# Patient Record
Sex: Female | Born: 1985 | Race: White | Hispanic: No | Marital: Married | Smoking: Never smoker
Health system: Southern US, Community
[De-identification: ages and names within clinical notes are randomized; demographics above are authoritative.]

## PROBLEM LIST (undated history)

## (undated) DIAGNOSIS — K589 Irritable bowel syndrome without diarrhea: Secondary | ICD-10-CM

## (undated) HISTORY — PX: MYRINGOTOMY WITH TUBE PLACEMENT: SHX5663

## (undated) HISTORY — PX: WISDOM TOOTH EXTRACTION: SHX21

---

## 2012-08-03 LAB — OB RESULTS CONSOLE ANTIBODY SCREEN: Antibody Screen: NEGATIVE

## 2012-08-03 LAB — OB RESULTS CONSOLE HEPATITIS B SURFACE ANTIGEN: Hepatitis B Surface Ag: NEGATIVE

## 2012-08-03 LAB — OB RESULTS CONSOLE RUBELLA ANTIBODY, IGM: Rubella: IMMUNE

## 2012-08-03 LAB — OB RESULTS CONSOLE RPR: RPR: NONREACTIVE

## 2012-08-03 LAB — OB RESULTS CONSOLE ABO/RH: RH Type: POSITIVE

## 2012-08-03 LAB — OB RESULTS CONSOLE HIV ANTIBODY (ROUTINE TESTING): HIV: NONREACTIVE

## 2012-08-17 LAB — OB RESULTS CONSOLE GC/CHLAMYDIA
CHLAMYDIA, DNA PROBE: NEGATIVE
GC PROBE AMP, GENITAL: NEGATIVE

## 2012-08-18 ENCOUNTER — Inpatient Hospital Stay (HOSPITAL_COMMUNITY): Admission: AD | Admit: 2012-08-18 | Payer: Self-pay | Source: Ambulatory Visit | Admitting: Obstetrics

## 2013-01-13 NOTE — L&D Delivery Note (Signed)
Operative Delivery Note At 1:30 AM a viable and healthy female was delivered via Vaginal, Vacuum Investment banker, operational(Extractor).  Presentation: vertex; Position: Occiput,, Anterior; Station: +3.  Verbal consent: obtained from patient.  Risks and benefits discussed in detail.  Risks include, but are not limited to the risks of anesthesia, bleeding, infection, damage to maternal tissues, fetal cephalhematoma.  There is also the risk of inability to effect vaginal delivery of the head, or shoulder dystocia that cannot be resolved by established maneuvers, leading to the need for emergency cesarean section. Due to fetal heart decelerations/deep variable decels APGAR: 8, 9; weight .  7lb 9 oz Placenta status: Intact, Spontaneous. Not sent   Cord: 3 vessels with the following complications: None.  Cord next to right leg Cord pH: none  Anesthesia: Epidural  Instruments:mushroom vacuum Episiotomy: None Lacerations: 2nd degree perineal ; right vaginal Sulcus Suture Repair: 3.0 chromic Est. Blood Loss (mL): 250  Mom to postpartum.  Baby to Couplet care / Skin to Skin.  Denise Medina A 03/02/2013, 2:17 AM

## 2013-02-08 LAB — OB RESULTS CONSOLE GBS: STREP GROUP B AG: NEGATIVE

## 2013-03-01 ENCOUNTER — Inpatient Hospital Stay (HOSPITAL_COMMUNITY)
Admission: AD | Admit: 2013-03-01 | Discharge: 2013-03-01 | Disposition: A | Discharge status: Home / Self Care | Payer: BC Managed Care – PPO | Source: Ambulatory Visit | Attending: Obstetrics and Gynecology | Admitting: Obstetrics and Gynecology

## 2013-03-01 ENCOUNTER — Encounter (HOSPITAL_COMMUNITY): Payer: Self-pay | Admitting: *Deleted

## 2013-03-01 ENCOUNTER — Inpatient Hospital Stay (HOSPITAL_COMMUNITY): Payer: BC Managed Care – PPO | Admitting: Anesthesiology

## 2013-03-01 ENCOUNTER — Inpatient Hospital Stay (HOSPITAL_COMMUNITY)
Admission: AD | Admit: 2013-03-01 | Discharge: 2013-03-04 | DRG: 775 | Disposition: A | Discharge status: Home / Self Care | Payer: BC Managed Care – PPO | Source: Ambulatory Visit | Attending: Obstetrics and Gynecology | Admitting: Obstetrics and Gynecology

## 2013-03-01 ENCOUNTER — Encounter (HOSPITAL_COMMUNITY): Payer: BC Managed Care – PPO | Admitting: Anesthesiology

## 2013-03-01 DIAGNOSIS — K589 Irritable bowel syndrome without diarrhea: Secondary | ICD-10-CM | POA: Diagnosis present

## 2013-03-01 DIAGNOSIS — D62 Acute posthemorrhagic anemia: Secondary | ICD-10-CM | POA: Diagnosis present

## 2013-03-01 DIAGNOSIS — O9902 Anemia complicating childbirth: Secondary | ICD-10-CM | POA: Diagnosis present

## 2013-03-01 HISTORY — DX: Irritable bowel syndrome, unspecified: K58.9

## 2013-03-01 LAB — CBC
HCT: 33.3 % — ABNORMAL LOW (ref 36.0–46.0)
HEMOGLOBIN: 11.9 g/dL — AB (ref 12.0–15.0)
MCH: 32 pg (ref 26.0–34.0)
MCHC: 35.7 g/dL (ref 30.0–36.0)
MCV: 89.5 fL (ref 78.0–100.0)
Platelets: 181 10*3/uL (ref 150–400)
RBC: 3.72 MIL/uL — ABNORMAL LOW (ref 3.87–5.11)
RDW: 13.2 % (ref 11.5–15.5)
WBC: 14.3 10*3/uL — AB (ref 4.0–10.5)

## 2013-03-01 MED ORDER — FLEET ENEMA 7-19 GM/118ML RE ENEM: 1.0000 | ENEMA | RECTAL | Status: DC | PRN

## 2013-03-01 MED ORDER — PHENYLEPHRINE 40 MCG/ML (10ML) SYRINGE FOR IV PUSH (FOR BLOOD PRESSURE SUPPORT)
80.0000 ug | PREFILLED_SYRINGE | INTRAVENOUS | Status: DC | PRN
  Filled 2013-03-01: qty 2
  Filled 2013-03-01: qty 10

## 2013-03-01 MED ORDER — LACTATED RINGERS IV SOLN
INTRAVENOUS | Status: DC
  Administered 2013-03-01: via INTRAVENOUS

## 2013-03-01 MED ORDER — CITRIC ACID-SODIUM CITRATE 334-500 MG/5ML PO SOLN: 30.0000 mL | ORAL | Status: DC | PRN

## 2013-03-01 MED ORDER — EPHEDRINE 5 MG/ML INJ
10.0000 mg | INTRAVENOUS | Status: DC | PRN
Start: 2013-03-01 — End: 2013-03-02
  Filled 2013-03-01: qty 2

## 2013-03-01 MED ORDER — OXYTOCIN BOLUS FROM INFUSION: 500.0000 mL | INTRAVENOUS | Status: DC

## 2013-03-01 MED ORDER — FENTANYL 2.5 MCG/ML BUPIVACAINE 1/10 % EPIDURAL INFUSION (WH - ANES)
14.0000 mL/h | INTRAMUSCULAR | Status: DC | PRN
  Administered 2013-03-01: 14 mL/h via EPIDURAL
  Filled 2013-03-01: qty 125

## 2013-03-01 MED ORDER — OXYCODONE-ACETAMINOPHEN 5-325 MG PO TABS: 1.0000 | ORAL_TABLET | ORAL | Status: DC | PRN

## 2013-03-01 MED ORDER — LIDOCAINE HCL (PF) 1 % IJ SOLN
30.0000 mL | INTRAMUSCULAR | Status: AC | PRN
  Administered 2013-03-02: 30 mL via SUBCUTANEOUS
  Filled 2013-03-01: qty 30

## 2013-03-01 MED ORDER — LIDOCAINE HCL (PF) 1 % IJ SOLN
INTRAMUSCULAR | Status: DC | PRN
  Administered 2013-03-01 (×4): 4 mL

## 2013-03-01 MED ORDER — BUTORPHANOL TARTRATE 1 MG/ML IJ SOLN: 2.0000 mg | INTRAMUSCULAR | Status: DC | PRN

## 2013-03-01 MED ORDER — OXYTOCIN 40 UNITS IN LACTATED RINGERS INFUSION - SIMPLE MED
62.5000 mL/h | INTRAVENOUS | Status: DC
  Administered 2013-03-02: 62.5 mL/h via INTRAVENOUS
  Filled 2013-03-01: qty 1000

## 2013-03-01 MED ORDER — PHENYLEPHRINE 40 MCG/ML (10ML) SYRINGE FOR IV PUSH (FOR BLOOD PRESSURE SUPPORT)
80.0000 ug | PREFILLED_SYRINGE | INTRAVENOUS | Status: DC | PRN
  Filled 2013-03-01: qty 2

## 2013-03-01 MED ORDER — DIPHENHYDRAMINE HCL 50 MG/ML IJ SOLN: 12.5000 mg | INTRAMUSCULAR | Status: DC | PRN

## 2013-03-01 MED ORDER — ACETAMINOPHEN 325 MG PO TABS: 650.0000 mg | ORAL_TABLET | ORAL | Status: DC | PRN

## 2013-03-01 MED ORDER — LACTATED RINGERS IV SOLN: 500.0000 mL | INTRAVENOUS | Status: DC | PRN

## 2013-03-01 MED ORDER — LACTATED RINGERS IV SOLN
500.0000 mL | Freq: Once | INTRAVENOUS | Status: DC
Start: 2013-03-01 — End: 2013-03-02

## 2013-03-01 MED ORDER — EPHEDRINE 5 MG/ML INJ
10.0000 mg | INTRAVENOUS | Status: DC | PRN
  Filled 2013-03-01: qty 4
  Filled 2013-03-01: qty 2

## 2013-03-01 MED ORDER — IBUPROFEN 600 MG PO TABS: 600.0000 mg | ORAL_TABLET | Freq: Four times a day (QID) | ORAL | Status: DC | PRN

## 2013-03-01 MED ORDER — ONDANSETRON HCL 4 MG/2ML IJ SOLN: 4.0000 mg | Freq: Four times a day (QID) | INTRAMUSCULAR | Status: DC | PRN

## 2013-03-01 NOTE — MAU Provider Note (Signed)
  History   Labor evaluation Good FM, no bleeding or ROM  CSN: [id number]  Arrival date and time: 03/01/13 [id number]   None     Chief Complaint  Patient presents with  . Labor Eval   HPI  OB History   Grav Para Term Preterm Abortions TAB SAB Ect Mult Living   1         0      Past Medical History  Diagnosis Date  . IBS (irritable bowel syndrome)     Past Surgical History  Procedure Laterality Date  . Wisdom tooth extraction    . Myringotomy with tube placement      childhood    Family History  Problem Relation Age of Onset  . Hypertension Mother   . Heart disease Father   . Hypertension Maternal Grandmother   . Heart disease Maternal Grandfather   . Heart disease Paternal Grandmother   . Stroke Paternal Grandmother   . Heart disease Paternal Grandfather     History  Substance Use Topics  . Smoking status: Never Smoker   . Smokeless tobacco: Never Used  . Alcohol Use: Yes     Comment: occas. prior to preg.    Allergies:  Allergies  Allergen Reactions  . Cranberry Nausea Only    One time, throat scratchy, felt swollen    No prescriptions prior to admission    ROS Physical Exam   Blood pressure 121/73, pulse 79, temperature 98.6 F (37 C), temperature source Oral, resp. rate 18.  Physical Exam VE per RN Reactive NST- rare contractions MAU Course  Procedures  MDM na  Assessment and Plan  Prodromal labor Reactive NsT.  DC home with labor warnings  Samiah Ricklefs J 03/01/2013, 11:29 AM

## 2013-03-01 NOTE — Progress Notes (Signed)
Patient given option to go home now or walk for 1 hour and be re-examined. Patient chooses to walk. Given water to drink.

## 2013-03-01 NOTE — MAU Note (Signed)
Contractions started at 0530, every 4-5 minutes. No leaking.  'lots of mucous d/c'. Was 2 cm yesterday.

## 2013-03-01 NOTE — Anesthesia Procedure Notes (Signed)
Epidural Patient location during procedure: OB Start time: 03/01/2013 10:50 PM  Staffing Performed by: anesthesiologist   Preanesthetic Checklist Completed: patient identified, site marked, surgical consent, pre-op evaluation, timeout performed, IV checked, risks and benefits discussed and monitors and equipment checked  Epidural Patient position: sitting Prep: site prepped and draped and DuraPrep Patient monitoring: continuous pulse ox and blood pressure Approach: midline Injection technique: LOR air  Needle:  Needle type: Tuohy  Needle gauge: 17 G Needle length: 9 cm and 9 Needle insertion depth: 5 cm cm Catheter type: closed end flexible Catheter size: 19 Gauge Catheter at skin depth: 10 cm Test dose: negative  Assessment Events: blood not aspirated, injection not painful, no injection resistance, negative IV test and no paresthesia  Additional Notes Discussed risk of headache, infection, bleeding, nerve injury and failed or incomplete block.  Patient voices understanding and wishes to proceed.  Epidural placed easily on first pass.  No paresthesia.  Patient tolerated procedure well with no apparent complications.  ARodman Pickle. Lacresha Fusilier MDReason for block:procedure for pain

## 2013-03-01 NOTE — MAU Note (Signed)
Patient states was discharge from MAU earlier today. Reports increase in contraction frequency; now every 2 minutes. Denies LOF. States having blood tinged mucous discharge. Reports good fetal movement. Was 2 cm earlier.

## 2013-03-01 NOTE — Anesthesia Preprocedure Evaluation (Signed)
Anesthesia Evaluation  Patient identified by MRN, date of birth, ID band Patient awake    Reviewed: Allergy & Precautions, H&P , NPO status , Patient's Chart, lab work & pertinent test results, reviewed documented beta blocker date and time   History of Anesthesia Complications Negative for: history of anesthetic complications  Airway Mallampati: I TM Distance: >3 FB Neck ROM: full    Dental  (+) Teeth Intact   Pulmonary neg pulmonary ROS,  breath sounds clear to auscultation        Cardiovascular negative cardio ROS  Rhythm:regular Rate:Normal     Neuro/Psych negative neurological ROS  negative psych ROS   GI/Hepatic Neg liver ROS, IBS   Endo/Other  negative endocrine ROSBMI 33  Renal/GU negative Renal ROS     Musculoskeletal   Abdominal   Peds  Hematology negative hematology ROS (+)   Anesthesia Other Findings   Reproductive/Obstetrics (+) Pregnancy                           Anesthesia Physical Anesthesia Plan  ASA: II  Anesthesia Plan: Epidural   Post-op Pain Management:    Induction:   Airway Management Planned:   Additional Equipment:   Intra-op Plan:   Post-operative Plan:   Informed Consent: I have reviewed the patients History and Physical, chart, labs and discussed the procedure including the risks, benefits and alternatives for the proposed anesthesia with the patient or authorized representative who has indicated his/her understanding and acceptance.     Plan Discussed with:   Anesthesia Plan Comments:         Anesthesia Quick Evaluation

## 2013-03-01 NOTE — H&P (Signed)
Denise Medina is a 28 y.o. female presenting @ 39 3/7 weeks for admission due to early labor Maternal Medical History:  Reason for admission: Contractions.   Fetal activity: Perceived fetal activity is normal.    Prenatal complications: no prenatal complications   OB History   Grav Para Term Preterm Abortions TAB SAB Ect Mult Living   1         0     Past Medical History  Diagnosis Date  . IBS (irritable bowel syndrome)    Past Surgical History  Procedure Laterality Date  . Wisdom tooth extraction    . Myringotomy with tube placement      childhood   Family History: family history includes Heart disease in her father, maternal grandfather, paternal grandfather, and paternal grandmother; Hypertension in her maternal grandmother and mother; Stroke in her paternal grandmother. Social History:  reports that she has never smoked. She has never used smokeless tobacco. She reports that she drinks alcohol. She reports that she does not use illicit drugs.   Prenatal Transfer Tool  Maternal Diabetes: No Genetic Screening: Declined Maternal Ultrasounds/Referrals: Normal Fetal Ultrasounds or other Referrals:  None Maternal Substance Abuse:  No Significant Maternal Medications:  None Significant Maternal Lab Results:  Lab values include: Group B Strep negative Other Comments:  AFP1 neg  ROS neg  Dilation: 3.5 Effacement (%): 80 Station: -1 Exam by:: Roxan Hockey. Robinson RN Blood pressure 136/79, pulse 77, temperature 98 F (36.7 C), temperature source Oral, resp. rate 18, height 5\' 4"  (1.626 m), weight 87.091 kg (192 lb), SpO2 100.00%. Exam Physical Exam  Constitutional: She is oriented to person, place, and time. She appears well-developed.  HENT:  Head: Atraumatic.  Eyes: EOM are normal.  Neck: Neck supple.  Cardiovascular: Regular rhythm.   Respiratory: Breath sounds normal.  GI: Soft.  Neurological: She is alert and oriented to person, place, and time.  Skin: Skin is warm and  dry.  Psychiatric: She has a normal mood and affect.    Prenatal labs: ABO, Rh: A/Positive/-- (07/22 0000) Antibody: Negative (07/22 0000) Rubella: Immune (07/22 0000) RPR: Nonreactive (07/22 0000)  HBsAg: Negative (07/22 0000)  HIV: Non-reactive (07/22 0000)  GBS: Negative (01/27 0000)   Assessment/Plan: Early Labor Term gestation P)admit routine labs. Epidural prn. Pitocin prn  Denise Medina A 03/01/2013, 10:17 PM

## 2013-03-01 NOTE — Discharge Instructions (Signed)
Keep your scheduled appointment for prenatal care. °

## 2013-03-02 ENCOUNTER — Encounter (HOSPITAL_COMMUNITY): Payer: Self-pay | Admitting: *Deleted

## 2013-03-02 LAB — RPR: RPR Ser Ql: NONREACTIVE

## 2013-03-02 MED ORDER — WITCH HAZEL-GLYCERIN EX PADS: 1.0000 "application " | MEDICATED_PAD | CUTANEOUS | Status: DC | PRN

## 2013-03-02 MED ORDER — OXYTOCIN 40 UNITS IN LACTATED RINGERS INFUSION - SIMPLE MED
1.0000 m[IU]/min | INTRAVENOUS | Status: DC
  Administered 2013-03-02: 2 m[IU]/min via INTRAVENOUS

## 2013-03-02 MED ORDER — PRENATAL MULTIVITAMIN CH
1.0000 | ORAL_TABLET | Freq: Every day | ORAL | Status: DC
  Administered 2013-03-02 – 2013-03-03 (×2): 1 via ORAL
  Filled 2013-03-02 (×2): qty 1

## 2013-03-02 MED ORDER — ONDANSETRON HCL 4 MG PO TABS: 4.0000 mg | ORAL_TABLET | ORAL | Status: DC | PRN

## 2013-03-02 MED ORDER — FLUTICASONE PROPIONATE 50 MCG/ACT NA SUSP
1.0000 | Freq: Every day | NASAL | Status: DC
  Administered 2013-03-02 – 2013-03-03 (×2): 1 via NASAL
  Filled 2013-03-02: qty 16

## 2013-03-02 MED ORDER — SIMETHICONE 80 MG PO CHEW: 80.0000 mg | CHEWABLE_TABLET | ORAL | Status: DC | PRN

## 2013-03-02 MED ORDER — FERROUS SULFATE 325 (65 FE) MG PO TABS
325.0000 mg | ORAL_TABLET | Freq: Two times a day (BID) | ORAL | Status: DC
  Administered 2013-03-02 – 2013-03-04 (×5): 325 mg via ORAL
  Filled 2013-03-02 (×5): qty 1

## 2013-03-02 MED ORDER — BENZOCAINE-MENTHOL 20-0.5 % EX AERO
1.0000 "application " | INHALATION_SPRAY | CUTANEOUS | Status: DC | PRN
  Administered 2013-03-04: 1 via TOPICAL
  Filled 2013-03-02 (×2): qty 56

## 2013-03-02 MED ORDER — TERBUTALINE SULFATE 1 MG/ML IJ SOLN: 0.2500 mg | Freq: Once | INTRAMUSCULAR | Status: DC | PRN

## 2013-03-02 MED ORDER — IBUPROFEN 600 MG PO TABS
600.0000 mg | ORAL_TABLET | Freq: Four times a day (QID) | ORAL | Status: DC
  Administered 2013-03-02 – 2013-03-04 (×9): 600 mg via ORAL
  Filled 2013-03-02 (×9): qty 1

## 2013-03-02 MED ORDER — SENNOSIDES-DOCUSATE SODIUM 8.6-50 MG PO TABS
2.0000 | ORAL_TABLET | ORAL | Status: DC
  Administered 2013-03-03 – 2013-03-04 (×2): 2 via ORAL
  Filled 2013-03-02 (×2): qty 2

## 2013-03-02 MED ORDER — ONDANSETRON HCL 4 MG/2ML IJ SOLN
4.0000 mg | INTRAMUSCULAR | Status: DC | PRN
Start: 2013-03-02 — End: 2013-03-04

## 2013-03-02 MED ORDER — LANOLIN HYDROUS EX OINT: TOPICAL_OINTMENT | CUTANEOUS | Status: DC | PRN

## 2013-03-02 MED ORDER — OXYCODONE-ACETAMINOPHEN 5-325 MG PO TABS
1.0000 | ORAL_TABLET | ORAL | Status: DC | PRN
  Administered 2013-03-02 – 2013-03-04 (×9): 1 via ORAL
  Filled 2013-03-02 (×9): qty 1

## 2013-03-02 MED ORDER — DIBUCAINE 1 % RE OINT
1.0000 "application " | TOPICAL_OINTMENT | RECTAL | Status: DC | PRN
  Administered 2013-03-02: 1 via RECTAL
  Filled 2013-03-02: qty 28

## 2013-03-02 MED ORDER — ZOLPIDEM TARTRATE 5 MG PO TABS
5.0000 mg | ORAL_TABLET | Freq: Every evening | ORAL | Status: DC | PRN
Start: 2013-03-02 — End: 2013-03-04

## 2013-03-02 MED ORDER — DIPHENHYDRAMINE HCL 25 MG PO CAPS: 25.0000 mg | ORAL_CAPSULE | Freq: Four times a day (QID) | ORAL | Status: DC | PRN

## 2013-03-02 MED ORDER — LACTATED RINGERS IV SOLN
INTRAVENOUS | Status: DC
Start: 2013-03-02 — End: 2013-03-02
  Administered 2013-03-02: 01:00:00 via INTRAUTERINE

## 2013-03-02 NOTE — Lactation Note (Signed)
This note was copied from the chart of Denise Dorothea GlassmanKatie Diclemente. Lactation Consultation Note Initial visit at 20 hours of age.  Mom reports several good feedings, but baby is fussy now she thinks she is gassy.  Baby does burp some with feedings.  Baby has had 3 voids and 4 stools.  Mom reports hearing some swallows.  Encouraged to feed with early feeding cues STS.  Feeding frequency discussed.  [facility] given and discussed.  Baby is laying STS on moms chest and rooting around.  Minimal assistance needed to assist with latch.  Baby latches well with few sucks on and off at this time.  Encouraged mom to call for assist as needed.     Patient Name: Denise Medina BJYNW'GToday's Date: 03/02/2013 Reason for consult: Initial assessment   Maternal Data Has patient been taught Hand Expression?: Yes  Feeding Feeding Type: Breast Fed Length of feed:  (few minutes)  LATCH Score/Interventions Latch: Grasps breast easily, tongue down, lips flanged, rhythmical sucking.  Audible Swallowing: A few with stimulation  Type of Nipple: Everted at rest and after stimulation  Comfort (Breast/Nipple): Soft / non-tender     Hold (Positioning): Assistance needed to correctly position infant at breast and maintain latch. Intervention(s): Breastfeeding basics reviewed;Support Pillows;Position options;Skin to skin  LATCH Score: 8  Lactation Tools Discussed/Used     Consult Status Consult Status: Follow-up Date: 03/03/13 Follow-up type: In-patient    Beverely RisenShoptaw, Arvella MerlesJana Lynn 03/02/2013, 10:27 PM

## 2013-03-02 NOTE — Progress Notes (Signed)
Denise Medina is a 28 y.o. G1P0 at [redacted]w[redacted]d by ultrasound admitted for active labor  Subjective: Chief Complaint  Patient presents with  . Labor Eval    Objective: BP 127/85  Pulse 76  Temp(Src) 97.5 F (36.4 C) (Oral)  Resp 18  Ht 5\' 4"  (1.626 m)  Wt 87.091 kg (192 lb)  BMI 32.94 kg/m2  SpO2 100%      FHT:  FHR: 150 bpm, variability: moderate,  accelerations:  Present,  decelerations:  Present after epidural lasted 3 mins UC:   irregular, every 2-3 minutes SVE:   6-7 cm dilated, 90% effaced, 0 station asynclytic AROM clear fluid. IUPC placed heavy show Tracing: cat 1  Labs: Lab Results  Component Value Date   WBC 14.3* 03/01/2013   HGB 11.9* 03/01/2013   HCT 33.3* 03/01/2013   MCV 89.5 03/01/2013   PLT 181 03/01/2013    Assessment / Plan: Spontaneous labor, progressing normally Term gestation P) exaggerated right sims. Pitocin augmentation Anticipated MOD:  NSVD  Denise Medina A 03/02/2013, 12:13 AM

## 2013-03-02 NOTE — Progress Notes (Signed)
S: notes pelvic pressure w/ ctx  O; Pitocin 2 miu VE  Unchanged ISE placed. Increased bloody show noted  Tracing: baseline 150  Some variable decels nonrepetitive Couplets/quad ctx  IMP: Variable decel due to cord compression  P) exaggerated right sims. amnioinfusion

## 2013-03-02 NOTE — Anesthesia Postprocedure Evaluation (Signed)
  Anesthesia Post-op Note  Patient: Denise Medina  Procedure(s) Performed: * No procedures listed *  Patient Location: Mother/Baby  Anesthesia Type:Epidural  Level of Consciousness: awake, alert , oriented and patient cooperative  Airway and Oxygen Therapy: Patient Spontanous Breathing  Post-op Pain: mild  Post-op Assessment: Patient's Cardiovascular Status Stable, Respiratory Function Stable, No headache, No backache, No residual numbness and No residual motor weakness  Post-op Vital Signs: stable  Complications: No apparent anesthesia complications

## 2013-03-03 LAB — CBC
HEMATOCRIT: 31 % — AB (ref 36.0–46.0)
Hemoglobin: 10.5 g/dL — ABNORMAL LOW (ref 12.0–15.0)
MCH: 31.3 pg (ref 26.0–34.0)
MCHC: 33.9 g/dL (ref 30.0–36.0)
MCV: 92.3 fL (ref 78.0–100.0)
Platelets: 178 10*3/uL (ref 150–400)
RBC: 3.36 MIL/uL — ABNORMAL LOW (ref 3.87–5.11)
RDW: 13.9 % (ref 11.5–15.5)
WBC: 13 10*3/uL — ABNORMAL HIGH (ref 4.0–10.5)

## 2013-03-03 MED ORDER — HYDROCORTISONE 1 % EX CREA
TOPICAL_CREAM | Freq: Four times a day (QID) | CUTANEOUS | Status: DC | PRN
  Administered 2013-03-03: 20:00:00 via TOPICAL
  Filled 2013-03-03: qty 28

## 2013-03-03 NOTE — Lactation Note (Signed)
This note was copied from the chart of Denise Dorothea GlassmanKatie Callow. Lactation Consultation Note  Patient Name: Denise Medina WUXLK'GToday's Date: 03/03/2013 Reason for consult: Follow-up assessment Mom reports she thinks baby is nursing well. Mom reports she had some mild tenderness last evening but this is improving with applying EBM before/after nursing. Baby asleep at this visit, recently fed. BF basics reviewed with Mom, cluster feeding discussed. Encouraged Mom to call for questions or concerns, or if she needs assist with latch.   Maternal Data    Feeding Feeding Type: Breast Fed Length of feed: 60 min  LATCH Score/Interventions Latch: Grasps breast easily, tongue down, lips flanged, rhythmical sucking.  Audible Swallowing: A few with stimulation Intervention(s): Skin to skin  Type of Nipple: Everted at rest and after stimulation  Comfort (Breast/Nipple): Soft / non-tender     Hold (Positioning): No assistance needed to correctly position infant at breast. Intervention(s): Support Pillows;Breastfeeding basics reviewed  LATCH Score: 9  Lactation Tools Discussed/Used     Consult Status Consult Status: Follow-up Date: 03/04/13 Follow-up type: In-patient    Denise Medina, Denise Medina 03/03/2013, 4:35 PM

## 2013-03-03 NOTE — Progress Notes (Addendum)
Patient ID: Denise KrabbeKatie V Catano, female   DOB: [birth year], 28 y.o.   MRN: [id number] PPD # 1 SVD  S:  Reports feeling well             Tolerating po/ No nausea or vomiting             Bleeding is spotting             Pain controlled with ibuprofen (OTC)             Up ad lib / ambulatory / voiding without difficulties , but reports urine has a strong "sulfur" smell   Newborn  Information for the patient's newborn:  [name], Girl Bless [[id number]][[id number]]  female  breast feeding   O:  A & O x 3, in no apparent distress              VS:  Filed Vitals:   03/02/13 0425 03/02/13 0830 03/02/13 1646 03/03/13 0653  BP: 120/76 118/78 112/68 106/69  Pulse: 94 78 82 81  Temp: 98.2 F (36.8 C) 98 F (36.7 C) 98.2 F (36.8 C) 97.5 F (36.4 C)  TempSrc: Oral Oral Oral Axillary  Resp: 18 18 18 19   Height:      Weight:      SpO2:    97%    LABS:  Recent Labs  03/01/13 2151 03/03/13 0625  WBC 14.3* 13.0*  HGB 11.9* 10.5*  HCT 33.3* 31.0*  PLT 181 178    Blood type: A/Positive/-- (07/22 0000)  Rubella: Immune (07/22 0000)   I&O: I/O last 3 completed shifts: In: -  Out: 250 [Blood:250]             Lungs: Clear and unlabored  Heart: regular rate and rhythm / no murmurs  Abdomen: soft, non-tender, non-distended             Fundus: firm, non-tender, U-1  Perineum: 2nd degree repair healing well  Lochia: spotting  Extremities: trace edema, no calf pain or tenderness, no Homans    A/P: PPD # 1  28 y.o., G1P1001, S/P Vacuum Extractor Assisted Vaginal Delivery with 2nd degree laceration    Doing well - stable status  Routine post partum orders  Anticipate discharge tomorrow    Raelyn MoraAWSON, Aeriana Speece, M, MSN, CNM 03/03/2013, 9:07 AM

## 2013-03-04 MED ORDER — OXYCODONE-ACETAMINOPHEN 5-325 MG PO TABS: 1.0000 | ORAL_TABLET | ORAL | Status: DC | PRN

## 2013-03-04 MED ORDER — IBUPROFEN 600 MG PO TABS: 600.0000 mg | ORAL_TABLET | Freq: Four times a day (QID) | ORAL | Status: DC | PRN

## 2013-03-04 MED ORDER — SENNOSIDES-DOCUSATE SODIUM 8.6-50 MG PO TABS: 2.0000 | ORAL_TABLET | Freq: Every evening | ORAL | Status: DC | PRN

## 2013-03-04 NOTE — Progress Notes (Signed)
Patient ID: Denise KrabbeKatie V Kratky, female   DOB: Jan 02, 1986, 28 y.o.   MRN: [id number] PPD # 2  Subjective: Pt reports feeling well, but still significant soreness to vaginal repair / Pain controlled with ibuprofen and percocet Tolerating po/ Voiding without problems/ No n/v Bleeding is light/ Newborn info:  Information for the patient's newborn:  [name], Girl Nadege [[id number]][[id number]]  female Feeding: breast    Objective:  VS: Blood pressure 102/62, pulse 74, temperature 98.5 F (36.9 C), temperature source Oral, resp. rate 18.    Recent Labs  03/01/13 2151 03/03/13 0625  WBC 14.3* 13.0*  HGB 11.9* 10.5*  HCT 33.3* 31.0*  PLT 181 178    Blood type: A/Positive Rubella: Immune    Physical Exam:  General:  alert, cooperative and no distress CV: Regular rate and rhythm Resp: clear Abdomen: soft, nontender, normal bowel sounds Uterine Fundus: firm, below umbilicus, nontender Perineum: healing with good reapproximation and mild edema persists Lochia: minimal Ext: Homans sign is negative, no sign of DVT and no edema, redness or tenderness in the calves or thighs    A/P: PPD # 2/ G1P1001/ S/P: VAVD with 2nd deg laceration with repair Mild ABL Anemia; urged red meat and dark leafy greens. Doing well and stable for discharge home RX: Ibuprofen 600mg  po Q 6 hrs prn pain #30 Refill x 1 OTC iron supplement Percocet 5/325 1 to 2 po Q 4 hrs prn pain #15 No refill Colace 100mg  po up to TID prn #30 Ref x 1 WOB/GYN booklet given Routine pp visit in 6wks   Demetrius RevelFISHER,Osmany Azer K, MSN, [facility] 03/04/2013, 8:49 AM

## 2013-03-04 NOTE — Discharge Summary (Signed)
Obstetric Discharge Summary Reason for Admission: G1 P0 @ 39wks in early labor Prenatal Procedures: NST and ultrasound Intrapartum Procedures: VAVD Postpartum Procedures: none Complications-Operative and Postpartum: 2nd degree perineal laceration Hemoglobin  Date Value Ref Range Status  03/03/2013 10.5* 12.0 - 15.0 g/dL Final     HCT  Date Value Ref Range Status  03/03/2013 31.0* 36.0 - 46.0 % Final    Physical Exam:  General: alert, cooperative and no distress Lochia: appropriate Uterine Fundus: firm Incision: 2nd deg repair well approximated DVT Evaluation: No evidence of DVT seen on physical exam. Negative Homan's sign.  Discharge Diagnoses: G1 P1 s/p VAVD with 2nd deg lac with repair  Discharge Information: Date: 03/04/2013 Activity: pelvic rest Diet: routine Medications: PNV, Ibuprofen, Colace and Percocet Condition: stable Instructions: refer to practice specific booklet Discharge to: home Follow-up Information   [facility] 6 weeks.   Specialty:  Obstetrics and Gynecology   Contact information:   [address]elda Severe[address] [place] [id number] [phone]       Newborn Data: Live born female on 03/02/13 Donnie Aho([place]) Birth Weight: 7 lb 9.3 oz (3440 g) APGAR: 8, 9  Home with mother.  FISHER,JULIE K 03/04/2013, 8:52 AM

## 2013-11-14 ENCOUNTER — Encounter (HOSPITAL_COMMUNITY): Payer: Self-pay | Admitting: *Deleted

## 2013-12-23 ENCOUNTER — Emergency Department: Payer: Self-pay | Admitting: Student

## 2013-12-23 LAB — HCG, QUANTITATIVE, PREGNANCY

## 2015-01-14 NOTE — L&D Delivery Note (Signed)
Delivery Note At 4:14 AM a viable female was delivered via Vaginal, Spontaneous Delivery (Presentation: ;  DOA).  APGAR: ,per nurse ; weight pending .   Placenta status: ,spontaneous, intact .  Cord:  3VC with the following complications: none.  Cord pH: not indicated  Anesthesia:  epidural Episiotomy: None Lacerations: 2nd degree Suture Repair: 2.0 vicryl rapide Est. Blood Loss (mL): 300  Mom to postpartum.  Baby to Couplet care / Skin to Skin.  Denise Medina A. 11/27/2015, 4:32 AM

## 2015-05-15 DIAGNOSIS — Z113 Encounter for screening for infections with a predominantly sexual mode of transmission: Secondary | ICD-10-CM | POA: Diagnosis not present

## 2015-05-15 DIAGNOSIS — Z3481 Encounter for supervision of other normal pregnancy, first trimester: Secondary | ICD-10-CM | POA: Diagnosis not present

## 2015-05-15 DIAGNOSIS — Z36 Encounter for antenatal screening of mother: Secondary | ICD-10-CM | POA: Diagnosis not present

## 2015-05-15 LAB — OB RESULTS CONSOLE RPR: RPR: NONREACTIVE

## 2015-05-15 LAB — OB RESULTS CONSOLE HEPATITIS B SURFACE ANTIGEN: HEP B S AG: NEGATIVE

## 2015-05-15 LAB — OB RESULTS CONSOLE GC/CHLAMYDIA
Chlamydia: NEGATIVE
Gonorrhea: NEGATIVE

## 2015-05-15 LAB — OB RESULTS CONSOLE HIV ANTIBODY (ROUTINE TESTING): HIV: NONREACTIVE

## 2015-05-15 LAB — OB RESULTS CONSOLE ANTIBODY SCREEN: Antibody Screen: NEGATIVE

## 2015-05-15 LAB — OB RESULTS CONSOLE ABO/RH: RH TYPE: POSITIVE

## 2015-05-15 LAB — OB RESULTS CONSOLE RUBELLA ANTIBODY, IGM: Rubella: IMMUNE

## 2015-06-14 DIAGNOSIS — N898 Other specified noninflammatory disorders of vagina: Secondary | ICD-10-CM | POA: Diagnosis not present

## 2015-06-14 DIAGNOSIS — Z36 Encounter for antenatal screening of mother: Secondary | ICD-10-CM | POA: Diagnosis not present

## 2015-06-14 DIAGNOSIS — Z3482 Encounter for supervision of other normal pregnancy, second trimester: Secondary | ICD-10-CM | POA: Diagnosis not present

## 2015-07-06 DIAGNOSIS — Z36 Encounter for antenatal screening of mother: Secondary | ICD-10-CM | POA: Diagnosis not present

## 2015-07-13 DIAGNOSIS — Z3A19 19 weeks gestation of pregnancy: Secondary | ICD-10-CM | POA: Diagnosis not present

## 2015-07-13 DIAGNOSIS — O26892 Other specified pregnancy related conditions, second trimester: Secondary | ICD-10-CM | POA: Diagnosis not present

## 2015-07-27 IMAGING — CR DG LUMBAR SPINE 2-3V
1 series · 3 of 3 positions shown · non-contrast
Comparison: None.

CLINICAL DATA: Fell from a fork lift and hit back.

EXAM:
LUMBAR SPINE - 2-3 VIEW; PELVIS - 1-2 VIEW

[Series 1: dxr lumbar spine ap and lateral · 0.14mm/px · 3 of 3 slices shown]
[im 1/3]
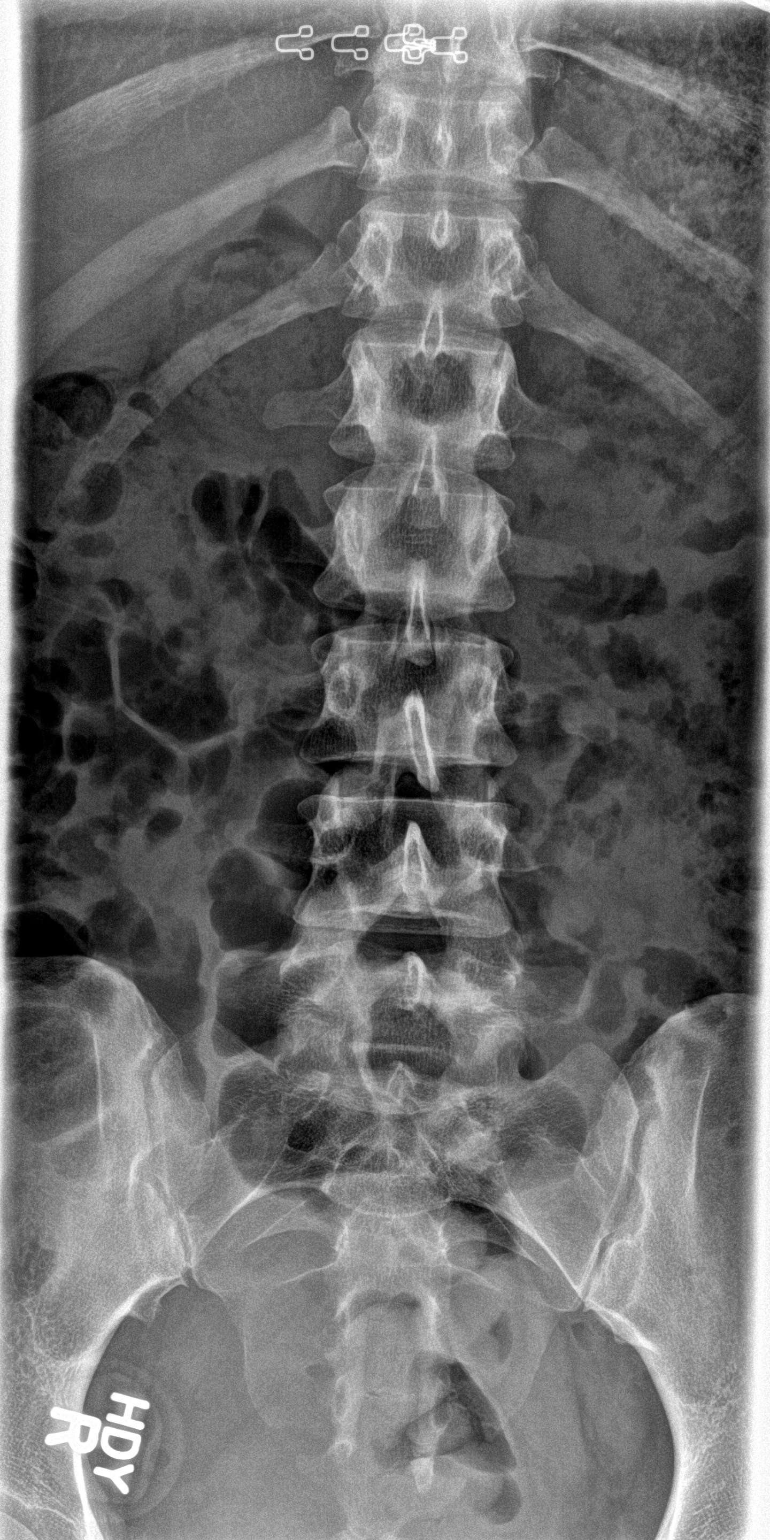
[im 2/3]
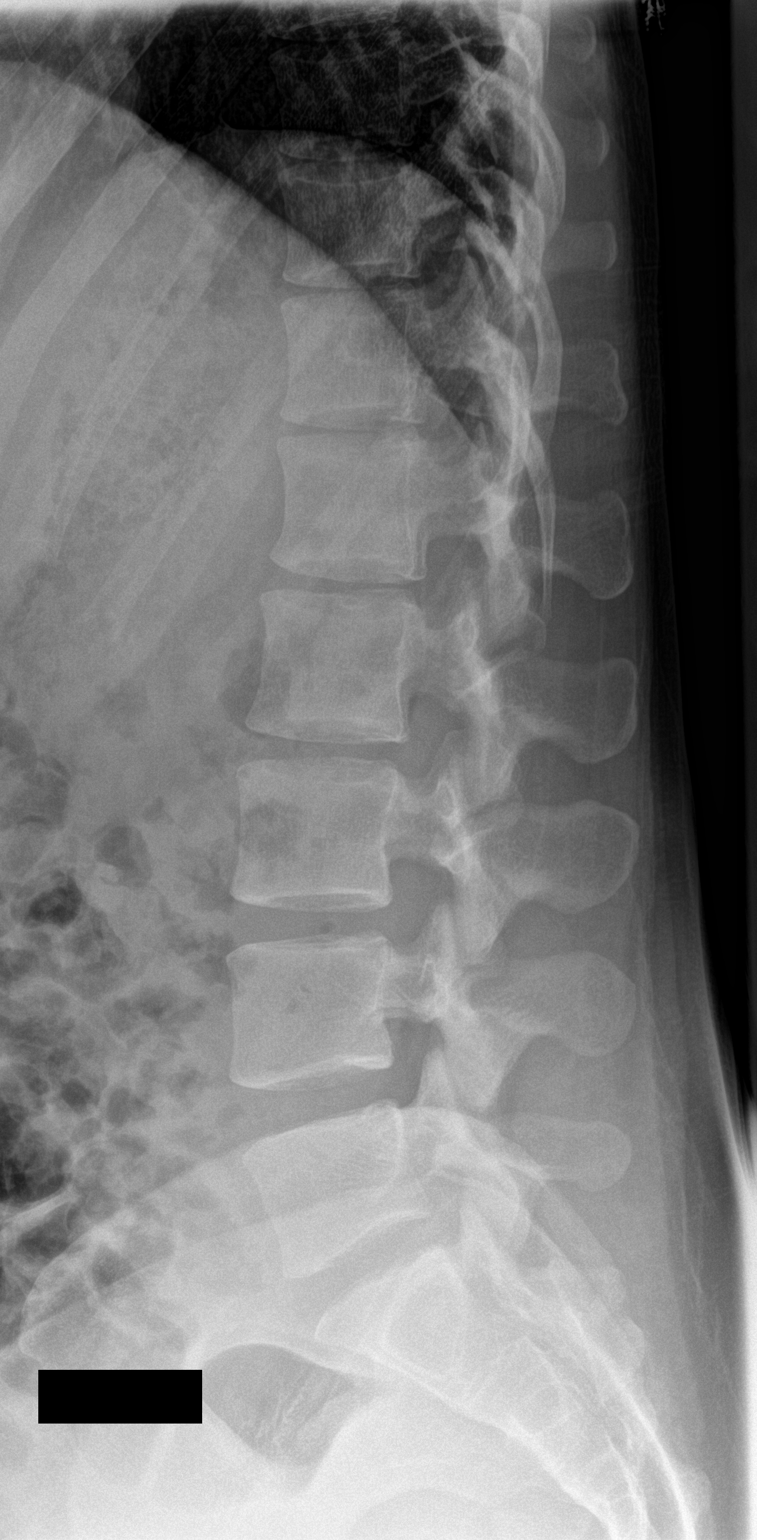
[im 3/3]
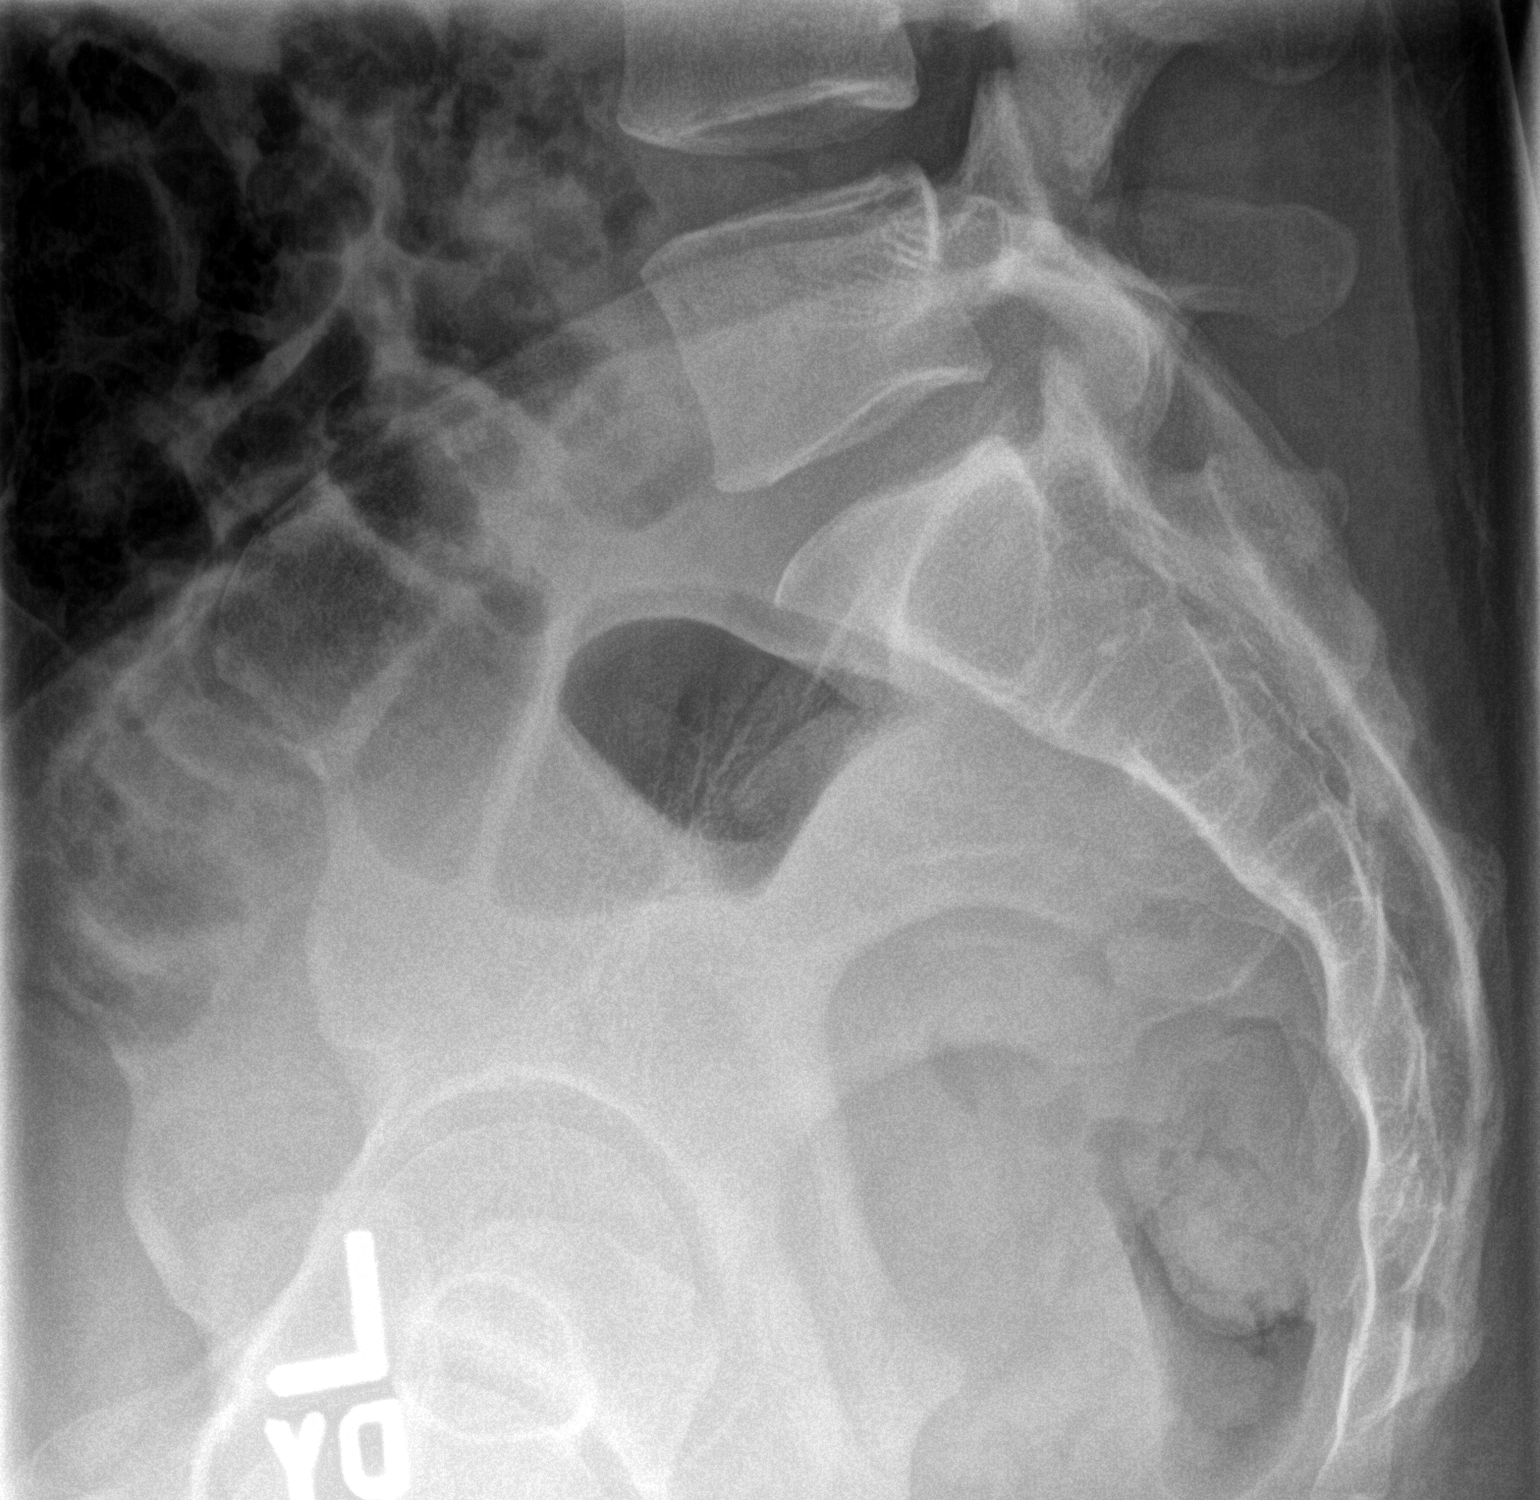

[3 of 3 positions shown; findings below may reference images not displayed]

FINDINGS: Lumbar spine:

Normal alignment of the lumbar vertebral bodies. Disc spaces and
vertebral bodies are maintained. The facets are normally aligned. No
pars defects. The visualized bony pelvis is intact.

Pelvis:

Both hips are normally located. No acute fracture. The pubic
symphysis and SI joints are intact. No pelvic fractures.
IMPRESSION: No acute bony findings.

## 2015-08-29 DIAGNOSIS — Z36 Encounter for antenatal screening of mother: Secondary | ICD-10-CM | POA: Diagnosis not present

## 2015-08-29 DIAGNOSIS — Z3A26 26 weeks gestation of pregnancy: Secondary | ICD-10-CM | POA: Diagnosis not present

## 2015-08-29 DIAGNOSIS — N898 Other specified noninflammatory disorders of vagina: Secondary | ICD-10-CM | POA: Diagnosis not present

## 2015-08-29 DIAGNOSIS — O26892 Other specified pregnancy related conditions, second trimester: Secondary | ICD-10-CM | POA: Diagnosis not present

## 2015-09-14 ENCOUNTER — Inpatient Hospital Stay (HOSPITAL_COMMUNITY): Admission: AD | Admit: 2015-09-14 | Payer: BLUE CROSS/BLUE SHIELD | Source: Ambulatory Visit | Admitting: Obstetrics

## 2015-09-14 DIAGNOSIS — Z23 Encounter for immunization: Secondary | ICD-10-CM | POA: Diagnosis not present

## 2015-09-14 DIAGNOSIS — Z36 Encounter for antenatal screening of mother: Secondary | ICD-10-CM | POA: Diagnosis not present

## 2015-09-14 DIAGNOSIS — Z3A28 28 weeks gestation of pregnancy: Secondary | ICD-10-CM | POA: Diagnosis not present

## 2015-09-14 DIAGNOSIS — O26893 Other specified pregnancy related conditions, third trimester: Secondary | ICD-10-CM | POA: Diagnosis not present

## 2015-09-28 DIAGNOSIS — O26893 Other specified pregnancy related conditions, third trimester: Secondary | ICD-10-CM | POA: Diagnosis not present

## 2015-09-28 DIAGNOSIS — Z3A30 30 weeks gestation of pregnancy: Secondary | ICD-10-CM | POA: Diagnosis not present

## 2015-09-28 DIAGNOSIS — Z23 Encounter for immunization: Secondary | ICD-10-CM | POA: Diagnosis not present

## 2015-11-09 DIAGNOSIS — Z3A36 36 weeks gestation of pregnancy: Secondary | ICD-10-CM | POA: Diagnosis not present

## 2015-11-09 DIAGNOSIS — Z3685 Encounter for antenatal screening for Streptococcus B: Secondary | ICD-10-CM | POA: Diagnosis not present

## 2015-11-09 DIAGNOSIS — O26893 Other specified pregnancy related conditions, third trimester: Secondary | ICD-10-CM | POA: Diagnosis not present

## 2015-11-09 LAB — OB RESULTS CONSOLE GBS: GBS: NEGATIVE

## 2015-11-25 ENCOUNTER — Inpatient Hospital Stay (HOSPITAL_COMMUNITY): Payer: BLUE CROSS/BLUE SHIELD

## 2015-11-25 ENCOUNTER — Encounter (HOSPITAL_COMMUNITY): Payer: Self-pay | Admitting: Certified Nurse Midwife

## 2015-11-25 ENCOUNTER — Inpatient Hospital Stay (EMERGENCY_DEPARTMENT_HOSPITAL)
Admission: AD | Admit: 2015-11-25 | Discharge: 2015-11-25 | Disposition: A | Discharge status: Home / Self Care | Payer: BLUE CROSS/BLUE SHIELD | Source: Ambulatory Visit | Attending: Obstetrics & Gynecology | Admitting: Obstetrics & Gynecology

## 2015-11-25 DIAGNOSIS — Z3A39 39 weeks gestation of pregnancy: Secondary | ICD-10-CM | POA: Diagnosis not present

## 2015-11-25 DIAGNOSIS — Z3A00 Weeks of gestation of pregnancy not specified: Secondary | ICD-10-CM | POA: Diagnosis not present

## 2015-11-25 DIAGNOSIS — Z823 Family history of stroke: Secondary | ICD-10-CM | POA: Diagnosis not present

## 2015-11-25 DIAGNOSIS — O133 Gestational [pregnancy-induced] hypertension without significant proteinuria, third trimester: Secondary | ICD-10-CM

## 2015-11-25 DIAGNOSIS — O163 Unspecified maternal hypertension, third trimester: Secondary | ICD-10-CM

## 2015-11-25 DIAGNOSIS — Z3A38 38 weeks gestation of pregnancy: Secondary | ICD-10-CM

## 2015-11-25 DIAGNOSIS — O2243 Hemorrhoids in pregnancy, third trimester: Secondary | ICD-10-CM | POA: Diagnosis not present

## 2015-11-25 DIAGNOSIS — Z0371 Encounter for suspected problem with amniotic cavity and membrane ruled out: Secondary | ICD-10-CM | POA: Diagnosis not present

## 2015-11-25 DIAGNOSIS — K219 Gastro-esophageal reflux disease without esophagitis: Secondary | ICD-10-CM | POA: Diagnosis not present

## 2015-11-25 DIAGNOSIS — Z8249 Family history of ischemic heart disease and other diseases of the circulatory system: Secondary | ICD-10-CM | POA: Diagnosis not present

## 2015-11-25 DIAGNOSIS — Z23 Encounter for immunization: Secondary | ICD-10-CM | POA: Diagnosis not present

## 2015-11-25 DIAGNOSIS — O9962 Diseases of the digestive system complicating childbirth: Secondary | ICD-10-CM | POA: Diagnosis not present

## 2015-11-25 DIAGNOSIS — O26893 Other specified pregnancy related conditions, third trimester: Secondary | ICD-10-CM

## 2015-11-25 DIAGNOSIS — Z412 Encounter for routine and ritual male circumcision: Secondary | ICD-10-CM | POA: Diagnosis not present

## 2015-11-25 DIAGNOSIS — Z3403 Encounter for supervision of normal first pregnancy, third trimester: Secondary | ICD-10-CM | POA: Diagnosis not present

## 2015-11-25 DIAGNOSIS — N898 Other specified noninflammatory disorders of vagina: Secondary | ICD-10-CM

## 2015-11-25 DIAGNOSIS — O134 Gestational [pregnancy-induced] hypertension without significant proteinuria, complicating childbirth: Secondary | ICD-10-CM | POA: Diagnosis not present

## 2015-11-25 LAB — PROTEIN / CREATININE RATIO, URINE
Creatinine, Urine: 59 mg/dL
Protein Creatinine Ratio: 0.12 mg/mg{Cre} (ref 0.00–0.15)
TOTAL PROTEIN, URINE: 7 mg/dL

## 2015-11-25 LAB — COMPREHENSIVE METABOLIC PANEL
ALK PHOS: 127 U/L — AB (ref 38–126)
ALT: 16 U/L (ref 14–54)
AST: 19 U/L (ref 15–41)
Albumin: 3 g/dL — ABNORMAL LOW (ref 3.5–5.0)
Anion gap: 7 (ref 5–15)
BILIRUBIN TOTAL: 0.8 mg/dL (ref 0.3–1.2)
BUN: 11 mg/dL (ref 6–20)
CALCIUM: 9.1 mg/dL (ref 8.9–10.3)
CO2: 21 mmol/L — ABNORMAL LOW (ref 22–32)
CREATININE: 0.56 mg/dL (ref 0.44–1.00)
Chloride: 106 mmol/L (ref 101–111)
Glucose, Bld: 108 mg/dL — ABNORMAL HIGH (ref 65–99)
Potassium: 4.1 mmol/L (ref 3.5–5.1)
SODIUM: 134 mmol/L — AB (ref 135–145)
TOTAL PROTEIN: 6.4 g/dL — AB (ref 6.5–8.1)

## 2015-11-25 LAB — CBC
HEMATOCRIT: 31.9 % — AB (ref 36.0–46.0)
HEMOGLOBIN: 11.4 g/dL — AB (ref 12.0–15.0)
MCH: 32.6 pg (ref 26.0–34.0)
MCHC: 35.7 g/dL (ref 30.0–36.0)
MCV: 91.1 fL (ref 78.0–100.0)
Platelets: 175 10*3/uL (ref 150–400)
RBC: 3.5 MIL/uL — AB (ref 3.87–5.11)
RDW: 13.6 % (ref 11.5–15.5)
WBC: 10.3 10*3/uL (ref 4.0–10.5)

## 2015-11-25 LAB — URINALYSIS, ROUTINE W REFLEX MICROSCOPIC
Bilirubin Urine: NEGATIVE
GLUCOSE, UA: NEGATIVE mg/dL
Hgb urine dipstick: NEGATIVE
Ketones, ur: NEGATIVE mg/dL
Nitrite: NEGATIVE
PH: 6 (ref 5.0–8.0)
Protein, ur: NEGATIVE mg/dL
Specific Gravity, Urine: 1.01 (ref 1.005–1.030)

## 2015-11-25 LAB — URINE MICROSCOPIC-ADD ON

## 2015-11-25 LAB — AMNISURE RUPTURE OF MEMBRANE (ROM) NOT AT ARMC: Amnisure ROM: NEGATIVE

## 2015-11-25 LAB — POCT FERN TEST

## 2015-11-25 NOTE — MAU Note (Signed)
Pt states she woke at 430AM and had a gush of fluid. Pt continues to leak small amounts. Pt denies ctxs and vaginal bleeding. Fetus is active.

## 2015-11-25 NOTE — MAU Provider Note (Signed)
Chief Complaint  Patient presents with  . Rupture of Membranes     First Provider Initiated Contact with Patient 11/25/15 1356      S: Maree KrabbeKatie V Crutcher  is a 30 y.o. y.o. year old 682P1001 female at 1120w5d weeks gestation who presents to MAU reporting leaking of Clear fluid since [id number]. Had a gush of fluid initially when she got up to go to the bathroom and has had a small amount of continuous leaking since then. Two mildly elevated BP's noted at MAU visit today. No Hx of HTN per pt or PNR.   Reports using Evening Primrose Oil vaginally daily for the past week.   Contractions: None Vaginal bleeding: None Fetal movement: Normal Denies HA, vision changes or epigastric pain.  O:  Patient Vitals for the past 24 hrs:  BP Temp Temp src Pulse Resp SpO2  11/25/15 1549 139/75 - - 88 18 -  11/25/15 1442 121/67 - - 85 - -  11/25/15 1435 117/70 - - 89 - -  11/25/15 1425 122/72 - - 91 - -  11/25/15 1250 148/87 - - 94 - 99 %  11/25/15 1249 148/87 98.1 F (36.7 C) Oral 89 20 -   General: NAD Heart: Regular rate Lungs: Normal rate and effort Abd: Soft, NT, Gravid, S=D Pelvic: NEFG, Neg pooling, no blood.  Dilation: 1.5 Effacement (%): 70 Cervical Position: Middle Station: -3 Presentation: Vertex Exam by:: AYetta Barre. Jones RNC  EFM: 130, reactive Toco: Irreg, mild  Neg Fern Neg Amnisure  Results for orders placed or performed during the hospital encounter of 11/25/15 (from the past 24 hour(s))  Amnisure rupture [facility] membrane (rom)not [facility]     Status: None   Collection Time: 11/25/15  2:15 PM  Result Value Ref Range   Amnisure ROM NEGATIVE   CBC     Status: Abnormal   Collection Time: 11/25/15  2:47 PM  Result Value Ref Range   WBC 10.3 4.0 - 10.5 K/uL   RBC 3.50 (L) 3.87 - 5.11 MIL/uL   Hemoglobin 11.4 (L) 12.0 - 15.0 g/dL   HCT 16.131.9 (L) 09.636.0 - 04.546.0 %   MCV 91.1 78.0 - 100.0 fL   MCH 32.6 26.0 - 34.0 pg   MCHC 35.7 30.0 - 36.0 g/dL   RDW 40.913.6 81.111.5 - 91.415.5 %   Platelets 175 150 - 400  K/uL  Comprehensive metabolic panel     Status: Abnormal   Collection Time: 11/25/15  2:47 PM  Result Value Ref Range   Sodium 134 (L) 135 - 145 mmol/L   Potassium 4.1 3.5 - 5.1 mmol/L   Chloride 106 101 - 111 mmol/L   CO2 21 (L) 22 - 32 mmol/L   Glucose, Bld 108 (H) 65 - 99 mg/dL   BUN 11 6 - 20 mg/dL   Creatinine, Ser 7.820.56 0.44 - 1.00 mg/dL   Calcium 9.1 8.9 - 95.610.3 mg/dL   Total Protein 6.4 (L) 6.5 - 8.1 g/dL   Albumin 3.0 (L) 3.5 - 5.0 g/dL   AST 19 15 - 41 U/L   ALT 16 14 - 54 U/L   Alkaline Phosphatase 127 (H) 38 - 126 U/L   Total Bilirubin 0.8 0.3 - 1.2 mg/dL   GFR calc non Af Amer >60 >60 mL/min   GFR calc Af Amer >60 >60 mL/min   Anion gap 7 5 - 15  Protein / creatinine ratio, urine     Status: None   Collection Time: 11/25/15  2:55 PM  Result Value Ref Range   Creatinine, Urine 59.00 mg/dL   Total Protein, Urine 7 mg/dL   Protein Creatinine Ratio 0.12 0.00 - 0.15 mg/mg[Cre]  Urinalysis, Routine w reflex microscopic (not at [facility])     Status: Abnormal   Collection Time: 11/25/15  2:55 PM  Result Value Ref Range   Color, Urine YELLOW YELLOW   APPearance HAZY (A) CLEAR   Specific Gravity, Urine 1.010 1.005 - 1.030   pH 6.0 5.0 - 8.0   Glucose, UA NEGATIVE NEGATIVE mg/dL   Hgb urine dipstick NEGATIVE NEGATIVE   Bilirubin Urine NEGATIVE NEGATIVE   Ketones, ur NEGATIVE NEGATIVE mg/dL   Protein, ur NEGATIVE NEGATIVE mg/dL   Nitrite NEGATIVE NEGATIVE   Leukocytes, UA SMALL (A) NEGATIVE  Urine microscopic-add on     Status: Abnormal   Collection Time: 11/25/15  2:55 PM  Result Value Ref Range   Squamous Epithelial / LPF 0-5 (A) NONE SEEN   WBC, UA 6-30 0 - 5 WBC/hpf   RBC / HPF 0-5 0 - 5 RBC/hpf   Bacteria, UA MANY (A) NONE SEEN  POCT fern test     Status: Normal   Collection Time: 11/25/15  3:19 PM  Result Value Ref Range   POCT Fern Test     RN notified Dr. Seymour BarsLavoie of Hx, exam, BP's. She ordered BPP w/ AFI-->BPP 8/8, AFI 16 cm  A: 7494w5d week IUP No evidence of  SROM. Suspect leaking was Evening Primrose oil.  GHT w/out evidence of Pre-E. FHR reactive  P: Discharge home in stable condition per consult w/ Genia DelMarie-Lyne Lavoie, MD. Labor precautions and fetal kick counts. Pre-E precautions Follow-up tomorrow as scheduled for prenatal visit and BP or sooner as needed if symptoms worsen. Return to maternity admissions as needed if symptoms worsen.  [place] Arden [place], CNM 11/25/2015 2:11 PM  2

## 2015-11-25 NOTE — MAU Note (Signed)
RN spoke to Dr. Seymour BarsLavoie regarding ctxs, FHT, and VS. Orders received to obtain 1 additional BP and if abnormal, order CBC, CMP, and protein/creatinine ratio. RN states that 3 fern slides, a speculum exam by Ivonne AndrewV. Smith CNM, and an amnisure are all negative for amniotic fluid. Dr. Seymour BarsLavoie orders U/S for AFI. Subsequent BP is 121/67. Pt denies HA, epigastric pain, or visual changes.

## 2015-11-25 NOTE — Discharge Instructions (Signed)

## 2015-11-26 ENCOUNTER — Encounter (HOSPITAL_COMMUNITY): Payer: Self-pay

## 2015-11-26 ENCOUNTER — Inpatient Hospital Stay (HOSPITAL_COMMUNITY)
Admission: AD | Admit: 2015-11-26 | Discharge: 2015-11-28 | DRG: 775 | Disposition: A | Discharge status: Home / Self Care | Payer: BLUE CROSS/BLUE SHIELD | Source: Ambulatory Visit | Attending: Obstetrics | Admitting: Obstetrics

## 2015-11-26 DIAGNOSIS — Z3A39 39 weeks gestation of pregnancy: Secondary | ICD-10-CM | POA: Diagnosis not present

## 2015-11-26 DIAGNOSIS — Z823 Family history of stroke: Secondary | ICD-10-CM

## 2015-11-26 DIAGNOSIS — Z3403 Encounter for supervision of normal first pregnancy, third trimester: Secondary | ICD-10-CM | POA: Diagnosis present

## 2015-11-26 DIAGNOSIS — O2243 Hemorrhoids in pregnancy, third trimester: Secondary | ICD-10-CM | POA: Diagnosis present

## 2015-11-26 DIAGNOSIS — O9962 Diseases of the digestive system complicating childbirth: Secondary | ICD-10-CM | POA: Diagnosis present

## 2015-11-26 DIAGNOSIS — K219 Gastro-esophageal reflux disease without esophagitis: Secondary | ICD-10-CM | POA: Diagnosis present

## 2015-11-26 DIAGNOSIS — Z412 Encounter for routine and ritual male circumcision: Secondary | ICD-10-CM | POA: Diagnosis not present

## 2015-11-26 DIAGNOSIS — Z3A38 38 weeks gestation of pregnancy: Secondary | ICD-10-CM | POA: Diagnosis not present

## 2015-11-26 DIAGNOSIS — Z3A00 Weeks of gestation of pregnancy not specified: Secondary | ICD-10-CM | POA: Diagnosis not present

## 2015-11-26 DIAGNOSIS — Z8249 Family history of ischemic heart disease and other diseases of the circulatory system: Secondary | ICD-10-CM

## 2015-11-26 DIAGNOSIS — O134 Gestational [pregnancy-induced] hypertension without significant proteinuria, complicating childbirth: Secondary | ICD-10-CM | POA: Diagnosis not present

## 2015-11-26 LAB — CBC
HCT: 33.5 % — ABNORMAL LOW (ref 36.0–46.0)
Hemoglobin: 11.9 g/dL — ABNORMAL LOW (ref 12.0–15.0)
MCH: 32.4 pg (ref 26.0–34.0)
MCHC: 35.5 g/dL (ref 30.0–36.0)
MCV: 91.3 fL (ref 78.0–100.0)
PLATELETS: 166 10*3/uL (ref 150–400)
RBC: 3.67 MIL/uL — AB (ref 3.87–5.11)
RDW: 13.5 % (ref 11.5–15.5)
WBC: 15.1 10*3/uL — AB (ref 4.0–10.5)

## 2015-11-26 MED ORDER — LACTATED RINGERS IV SOLN
500.0000 mL | Freq: Once | INTRAVENOUS | Status: AC
  Administered 2015-11-27: 500 mL via INTRAVENOUS

## 2015-11-26 MED ORDER — PHENYLEPHRINE 40 MCG/ML (10ML) SYRINGE FOR IV PUSH (FOR BLOOD PRESSURE SUPPORT)
80.0000 ug | PREFILLED_SYRINGE | INTRAVENOUS | Status: DC | PRN
  Administered 2015-11-27: 80 ug via INTRAVENOUS
  Filled 2015-11-26: qty 5

## 2015-11-26 MED ORDER — EPHEDRINE 5 MG/ML INJ
10.0000 mg | INTRAVENOUS | Status: DC | PRN
  Filled 2015-11-26: qty 4

## 2015-11-26 MED ORDER — ACETAMINOPHEN 325 MG PO TABS: 650.0000 mg | ORAL_TABLET | ORAL | Status: DC | PRN

## 2015-11-26 MED ORDER — LACTATED RINGERS IV SOLN
500.0000 mL | INTRAVENOUS | Status: DC | PRN
  Administered 2015-11-27: 250 mL via INTRAVENOUS

## 2015-11-26 MED ORDER — OXYCODONE-ACETAMINOPHEN 5-325 MG PO TABS: 2.0000 | ORAL_TABLET | ORAL | Status: DC | PRN

## 2015-11-26 MED ORDER — OXYCODONE-ACETAMINOPHEN 5-325 MG PO TABS: 1.0000 | ORAL_TABLET | ORAL | Status: DC | PRN

## 2015-11-26 MED ORDER — OXYTOCIN BOLUS FROM INFUSION
500.0000 mL | Freq: Once | INTRAVENOUS | Status: AC
  Administered 2015-11-27: 500 mL via INTRAVENOUS

## 2015-11-26 MED ORDER — ONDANSETRON HCL 4 MG/2ML IJ SOLN: 4.0000 mg | Freq: Four times a day (QID) | INTRAMUSCULAR | Status: DC | PRN

## 2015-11-26 MED ORDER — PHENYLEPHRINE 40 MCG/ML (10ML) SYRINGE FOR IV PUSH (FOR BLOOD PRESSURE SUPPORT)
80.0000 ug | PREFILLED_SYRINGE | INTRAVENOUS | Status: DC | PRN
  Administered 2015-11-27: 80 ug via INTRAVENOUS
  Filled 2015-11-26 (×2): qty 10
  Filled 2015-11-26: qty 5

## 2015-11-26 MED ORDER — SOD CITRATE-CITRIC ACID 500-334 MG/5ML PO SOLN
30.0000 mL | ORAL | Status: DC | PRN
  Administered 2015-11-27: 30 mL via ORAL
  Filled 2015-11-26: qty 15

## 2015-11-26 MED ORDER — LIDOCAINE HCL (PF) 1 % IJ SOLN
30.0000 mL | INTRAMUSCULAR | Status: DC | PRN
  Filled 2015-11-26: qty 30

## 2015-11-26 MED ORDER — LACTATED RINGERS IV SOLN
INTRAVENOUS | Status: DC
  Administered 2015-11-27: 01:00:00 via INTRAVENOUS

## 2015-11-26 MED ORDER — FENTANYL 2.5 MCG/ML BUPIVACAINE 1/10 % EPIDURAL INFUSION (WH - ANES)
14.0000 mL/h | INTRAMUSCULAR | Status: DC | PRN
  Administered 2015-11-27: 14 mL/h via EPIDURAL
  Filled 2015-11-26: qty 100

## 2015-11-26 MED ORDER — FLEET ENEMA 7-19 GM/118ML RE ENEM: 1.0000 | ENEMA | RECTAL | Status: DC | PRN

## 2015-11-26 MED ORDER — OXYTOCIN 40 UNITS IN LACTATED RINGERS INFUSION - SIMPLE MED
2.5000 [IU]/h | INTRAVENOUS | Status: DC
  Filled 2015-11-26: qty 1000

## 2015-11-26 MED ORDER — DIPHENHYDRAMINE HCL 50 MG/ML IJ SOLN: 12.5000 mg | INTRAMUSCULAR | Status: DC | PRN

## 2015-11-27 ENCOUNTER — Inpatient Hospital Stay (HOSPITAL_COMMUNITY): Payer: BLUE CROSS/BLUE SHIELD | Admitting: Anesthesiology

## 2015-11-27 ENCOUNTER — Encounter (HOSPITAL_COMMUNITY): Payer: Self-pay

## 2015-11-27 LAB — RPR: RPR: NONREACTIVE

## 2015-11-27 LAB — TYPE AND SCREEN
ABO/RH(D): A POS
Antibody Screen: NEGATIVE

## 2015-11-27 LAB — ABO/RH: ABO/RH(D): A POS

## 2015-11-27 MED ORDER — ONDANSETRON HCL 4 MG PO TABS: 4.0000 mg | ORAL_TABLET | ORAL | Status: DC | PRN

## 2015-11-27 MED ORDER — OXYCODONE HCL 5 MG PO TABS
10.0000 mg | ORAL_TABLET | ORAL | Status: DC | PRN
Start: 2015-11-27 — End: 2015-11-28

## 2015-11-27 MED ORDER — SENNOSIDES-DOCUSATE SODIUM 8.6-50 MG PO TABS
2.0000 | ORAL_TABLET | ORAL | Status: DC
  Administered 2015-11-28: 2 via ORAL
  Filled 2015-11-27: qty 2

## 2015-11-27 MED ORDER — OXYCODONE HCL 5 MG PO TABS
5.0000 mg | ORAL_TABLET | ORAL | Status: DC | PRN
Start: 2015-11-27 — End: 2015-11-28
  Filled 2015-11-27: qty 1

## 2015-11-27 MED ORDER — WITCH HAZEL-GLYCERIN EX PADS
1.0000 "application " | MEDICATED_PAD | CUTANEOUS | Status: DC | PRN
  Administered 2015-11-27: 1 via TOPICAL

## 2015-11-27 MED ORDER — IBUPROFEN 600 MG PO TABS
600.0000 mg | ORAL_TABLET | Freq: Four times a day (QID) | ORAL | Status: DC
  Administered 2015-11-27 – 2015-11-28 (×5): 600 mg via ORAL
  Filled 2015-11-27 (×5): qty 1

## 2015-11-27 MED ORDER — ACETAMINOPHEN 325 MG PO TABS
650.0000 mg | ORAL_TABLET | ORAL | Status: DC | PRN
  Filled 2015-11-27: qty 2

## 2015-11-27 MED ORDER — FENTANYL 2.5 MCG/ML BUPIVACAINE 1/10 % EPIDURAL INFUSION (WH - ANES): 14.0000 mL/h | INTRAMUSCULAR | Status: DC | PRN

## 2015-11-27 MED ORDER — COCONUT OIL OIL
1.0000 "application " | TOPICAL_OIL | Status: DC | PRN
  Administered 2015-11-27: 1 via TOPICAL
  Filled 2015-11-27: qty 120

## 2015-11-27 MED ORDER — BENZOCAINE-MENTHOL 20-0.5 % EX AERO
1.0000 "application " | INHALATION_SPRAY | CUTANEOUS | Status: DC | PRN
  Administered 2015-11-27: 1 via TOPICAL
  Filled 2015-11-27: qty 56

## 2015-11-27 MED ORDER — PRENATAL MULTIVITAMIN CH
1.0000 | ORAL_TABLET | Freq: Every day | ORAL | Status: DC
  Administered 2015-11-27 – 2015-11-28 (×2): 1 via ORAL
  Filled 2015-11-27 (×3): qty 1

## 2015-11-27 MED ORDER — TETANUS-DIPHTH-ACELL PERTUSSIS 5-2.5-18.5 LF-MCG/0.5 IM SUSP: 0.5000 mL | Freq: Once | INTRAMUSCULAR | Status: DC

## 2015-11-27 MED ORDER — LIDOCAINE HCL (PF) 1 % IJ SOLN
INTRAMUSCULAR | Status: DC | PRN
  Administered 2015-11-27 (×2): 4 mL

## 2015-11-27 MED ORDER — SIMETHICONE 80 MG PO CHEW: 80.0000 mg | CHEWABLE_TABLET | ORAL | Status: DC | PRN

## 2015-11-27 MED ORDER — DIBUCAINE 1 % RE OINT
1.0000 "application " | TOPICAL_OINTMENT | RECTAL | Status: DC | PRN
  Administered 2015-11-27: 1 via RECTAL
  Filled 2015-11-27: qty 28

## 2015-11-27 MED ORDER — DIPHENHYDRAMINE HCL 25 MG PO CAPS: 25.0000 mg | ORAL_CAPSULE | Freq: Four times a day (QID) | ORAL | Status: DC | PRN

## 2015-11-27 MED ORDER — ZOLPIDEM TARTRATE 5 MG PO TABS: 5.0000 mg | ORAL_TABLET | Freq: Every evening | ORAL | Status: DC | PRN

## 2015-11-27 MED ORDER — ONDANSETRON HCL 4 MG/2ML IJ SOLN: 4.0000 mg | INTRAMUSCULAR | Status: DC | PRN

## 2015-11-27 NOTE — Progress Notes (Signed)
IV in right hand D/C per order.

## 2015-11-27 NOTE — Anesthesia Preprocedure Evaluation (Signed)
Anesthesia Evaluation  Patient identified by MRN, date of birth, ID band Patient awake    Reviewed: Allergy & Precautions, H&P , NPO status , Patient's Chart, lab work & pertinent test results, reviewed documented beta blocker date and time   History of Anesthesia Complications Negative for: history of anesthetic complications  Airway Mallampati: I  TM Distance: >3 FB Neck ROM: full    Dental  (+) Teeth Intact   Pulmonary neg pulmonary ROS,    breath sounds clear to auscultation       Cardiovascular hypertension,  Rhythm:regular Rate:Normal     Neuro/Psych negative neurological ROS  negative psych ROS   GI/Hepatic Neg liver ROS, IBS   Endo/Other  negative endocrine ROSBMI 33  Renal/GU negative Renal ROS     Musculoskeletal   Abdominal   Peds  Hematology negative hematology ROS (+)   Anesthesia Other Findings   Reproductive/Obstetrics (+) Pregnancy                             Anesthesia Physical  Anesthesia Plan  ASA: II  Anesthesia Plan: Epidural   Post-op Pain Management:    Induction:   Airway Management Planned:   Additional Equipment:   Intra-op Plan:   Post-operative Plan:   Informed Consent: I have reviewed the patients History and Physical, chart, labs and discussed the procedure including the risks, benefits and alternatives for the proposed anesthesia with the patient or authorized representative who has indicated his/her understanding and acceptance.     Plan Discussed with:   Anesthesia Plan Comments:         Anesthesia Quick Evaluation

## 2015-11-27 NOTE — Progress Notes (Signed)
Moderate amount of vaginal bleeding noted with trickling that stopped with fundal massage, no clots, fundus firm after massage.  Walked patient to the bathroom to attempt voiding and change pads for further monitoring.  Unable to void at this time.  Will continue to monitor.  Patient instructed to call for help to bathroom next time up. Verbalized understanding.  No further c/o

## 2015-11-27 NOTE — Lactation Note (Signed)
This note was copied from a baby's chart. Lactation Consultation Note  Patient Name: Denise Dorothea GlassmanKatie Medina Denise Medina: 11/27/2015 Reason for consult: Initial assessment (per mom abby recently breast fed at 1130 am for 15 mins , mom aware to page for latch assessment ) Baby is 8 hour old and has been to the breast several times since birth. Per mom breast fed 1st baby 3 months and this time desires to feed longer.  LC reviewed hand expressing and milk easily flows. Noted areola edema bilaterally and recommended to mom prior to latch - breast massage, hand express,  And reverse pressure due to edema. LC showed mom and had her repeat it and she did well . Areola more compressible and along with shells it will be preventive  Against soreness. LC showed mom how compressible the areola should be to get the depth at the breast before latch.  LC recommended and encouraged mom to call for latch assessment on the nurses light. LC informed mom LC's and RN's can do latch assessments. Mother informed of post-discharge support and given phone number to the lactation department, including services for phone call assistance; out-patient appointments; and breastfeeding support group. List of other breastfeeding resources in the community given in the handout. Encouraged mother to call for problems or concerns related to breastfeeding.  Maternal Data Has patient been taught Hand Expression?: Yes (LC reviewed hand expressing ) Does the patient have breastfeeding experience prior to this delivery?: Yes  Feeding Feeding Type: Breast Fed Length of feed: 15 min (per mom )  LATCH Score/Interventions                      Lactation Tools Discussed/Used Tools: Shells (due to areola edema . ) Shell Type: Inverted   Consult Status Consult Status: Follow-up Medina: 11/27/15 Follow-up type: In-patient    Matilde SprangMargaret Ann Gumecindo Hopkin 11/27/2015, 12:27 PM

## 2015-11-27 NOTE — Anesthesia Postprocedure Evaluation (Signed)
Anesthesia Post Note  Patient: Denise KrabbeKatie V Medina  Procedure(s) Performed: * No procedures listed *  Patient location during evaluation: Mother Baby Anesthesia Type: Epidural Level of consciousness: awake and alert Pain management: pain level controlled Vital Signs Assessment: post-procedure vital signs reviewed and stable Respiratory status: spontaneous breathing, nonlabored ventilation and respiratory function stable Cardiovascular status: stable Postop Assessment: no headache, no backache and epidural receding Anesthetic complications: no     Last Vitals:  Vitals:   11/27/15 0732 11/27/15 1152  BP: 122/68 (!) 111/51  Pulse: 93 81  Resp: 18 18  Temp: 36.8 C 37 C    Last Pain:  Vitals:   11/27/15 1152  TempSrc: Oral  PainSc:    Pain Goal:                 Junious SilkGILBERT,Purity Irmen

## 2015-11-27 NOTE — Plan of Care (Signed)
Problem: Life Cycle: Goal: Risk for postpartum hemorrhage will decrease Medium post partum hemorrhage risk

## 2015-11-27 NOTE — H&P (Signed)
Denise KrabbeKatie V Medina is a 30 y.o. G2P1001 at 475w0d presenting for active labor. Pt notes onset contractions around noon, increasing at 3p . Good fetal movement, No vaginal bleeding, not leaking fluid.  [facility] w[facility] - dated by 5/9 wk u/s - declined genetic screening - GERD - hemorrhoids   Prenatal Transfer Tool  Maternal Diabetes: No Genetic Screening: Declined Maternal Ultrasounds/Referrals: Normal Fetal Ultrasounds or other Referrals:  None Maternal Substance Abuse:  No Significant Maternal Medications:  None Significant Maternal Lab Results: None     OB History    Gravida Para Term Preterm AB Living   2 1 1     1    SAB TAB Ectopic Multiple Live Births           1     Past Medical History:  Diagnosis Date  . IBS (irritable bowel syndrome)   . Postpartum care following vaginal delivery 03/02/2013  . SVD (spontaneous vaginal delivery) 03/02/2013   Past Surgical History:  Procedure Laterality Date  . MYRINGOTOMY WITH TUBE PLACEMENT     childhood  . WISDOM TOOTH EXTRACTION     Family History: family history includes Heart disease in her father, maternal grandfather, paternal grandfather, and paternal grandmother; Hypertension in her maternal grandmother and mother; Stroke in her paternal grandmother. Social History:  reports that she has never smoked. She has never used smokeless tobacco. She reports that she drinks alcohol. She reports that she does not use drugs.  Review of Systems - Negative except contractions   Dilation: 6.5 Effacement (%): 90 Station: -2 Exam by:: PharmacologistAmber Stovall RN Blood pressure 129/75, pulse 104, temperature 98.7 F (37.1 C), temperature source Oral, resp. rate 17, height 5\' 4"  (1.626 m), weight 86.6 kg (191 lb), unknown if currently breastfeeding.  Physical Exam:  Gen: well appearing, no distress Back: no CVAT Abd: gravid, NT, no RUQ pain LE: no edema, equal bilaterally, non-tender Toco: q 2 min FH: baseline 130s,  accelerations present, no deceleratons, 10 beat variability  Prenatal labs: ABO, Rh: --/--/PENDING (11/13 2330): A+ Antibody: PENDING (11/13 2330) neg Rubella: !Error! immune RPR: Nonreactive (05/02 0000)  HBsAg: Negative (05/02 0000)  HIV: Non-reactive (05/02 0000)  GBS: Negative (10/27 0000)  1 hr Glucola 120  Genetic screening declined Anatomy US normal   Assessment/Plan: 30 y.o. G2P1001 at 445w0d Active labor at term, expectant managemetn Epidural Reactive fetal testing   Denise Medina A. 11/27/2015, 12:27 AM

## 2015-11-27 NOTE — Progress Notes (Signed)
Patient ID: Denise KrabbeKatie V Emberson, female   DOB: 10/07/1985, 30 y.o.   MRN: [id number] PPD # 1 SVD with 2nd Degree Perineal Laceration  S:  Reports feeling well.             Tolerating po/ No nausea or vomiting             Bleeding is light             Pain controlled with ibuprofen (OTC)             Up ad lib / ambulatory / voiding without difficulties     Information for the patient's newborn:  Denise Medina, Boy Keniya [[id number]][[id number]]  female  breast feeding  / Circumcision planning   O:  A & O x 3, in no apparent distress              VS:  Vitals:   11/27/15 0531 11/27/15 0546 11/27/15 0626 11/27/15 0732  BP: (!) 108/52 (!) 101/56 115/61 122/68  Pulse: 73 62 74 93  Resp:   18 18  Temp:   97.8 F (36.6 C) 98.3 F (36.8 C)  TempSrc:   Oral Oral  SpO2:      Weight:      Height:        LABS:  Recent Labs  11/25/15 1447 11/26/15 2330  WBC 10.3 15.1*  HGB 11.4* 11.9*  HCT 31.9* 33.5*  PLT 175 166    Blood type: A POS (11/13 2330)  Rubella: Immune (05/02 0000)      Abdomen: soft, non-tender, non-distended             Fundus: firm, non-tender, U-1  Perineum: 2nd degree perineal repair healing well, No edema  Lochia: minimal  Extremities: No edema, no calf pain or tenderness    A/P: PPD # 1 30 y.o., [id number]   Principal Problem:    Postpartum care following vaginal delivery (11/14)    Doing well - stable status  Routine post partum orders  Anticipate discharge tomorrow    Raelyn MoraAWSON, Tacia Hindley, M, MSN, CNM 11/27/2015, 10:15 AM

## 2015-11-28 MED ORDER — OXYCODONE HCL 5 MG PO TABS: 5.0000 mg | ORAL_TABLET | ORAL | 0 refills | Status: AC | PRN

## 2015-11-28 MED ORDER — HYDROCORTISONE ACETATE 25 MG RE SUPP: 25.0000 mg | Freq: Two times a day (BID) | RECTAL | 2 refills | Status: AC | PRN

## 2015-11-28 MED ORDER — IBUPROFEN 600 MG PO TABS: 600.0000 mg | ORAL_TABLET | Freq: Four times a day (QID) | ORAL | 0 refills | Status: AC

## 2015-11-28 NOTE — Progress Notes (Signed)
PPD 1 SVD with 2nd degree repair  S:  Reports feeling well - ready to go home             Tolerating po/ No nausea or vomiting             Bleeding is light             Pain controlled with motrin             Up ad lib / ambulatory / voiding QS / hx hemorrhoids  Newborn breast feeding  O:               VS: BP (!) 103/46 (BP Location: Left Arm)   Pulse 67   Temp 98.1 F (36.7 C) (Oral)   Resp 18   Ht 5\' 4"  (1.626 m)   Wt 86.6 kg (191 lb)   SpO2 98%   Breastfeeding? Unknown   BMI 32.79 kg/m    LABS:              Recent Labs  11/25/15 1447 11/26/15 2330  WBC 10.3 15.1*  HGB 11.4* 11.9*  PLT 175 166               Blood type: --/--/A POS, A POS (11/13 2330)  Rubella: Immune (05/02 0000)                   Tdap and flu current 2017              Physical Exam:             Alert and oriented X3  Abdomen: soft, non-tender, non-distended              Fundus: firm, non-tender, U-1  Perineum: no edema  Lochia: light  Extremities: no edema, no calf pain or tenderness    A: PPD # 1   Doing well - stable status  P: Routine post partum orders  DC home - [facility] booklet - instructions reveiwed             RX for hemorrhoid meds at home  Marlinda MikeBAILEY, TANYA CNM, MSN, [facility] 11/28/2015, 10:04 AM

## 2015-11-28 NOTE — Discharge Summary (Signed)
Obstetric Discharge Summary  Reason for Admission: onset of labor Prenatal Procedures: none Intrapartum Procedures: spontaneous vaginal delivery and epidural Postpartum Procedures: none Complications-Operative and Postpartum: 2nd degree perineal laceration Hemoglobin  Date Value Ref Range Status  11/26/2015 11.9 (L) 12.0 - 15.0 g/dL Final   HCT  Date Value Ref Range Status  11/26/2015 33.5 (L) 36.0 - 46.0 % Final    Physical Exam:  General: alert, cooperative and no distress Lochia: appropriate Uterine Fundus: firm Incision: healing well DVT Evaluation: No evidence of DVT seen on physical exam.  Discharge Diagnoses: Term Pregnancy-delivered  Discharge Information: Date: 11/28/2015 Activity: pelvic rest Diet: routine Medications: PNV, Ibuprofen and anusol-HC Condition: stable Instructions: refer to practice specific booklet Discharge to: home Follow-up Information    [facility]. Schedule an appointment as soon as possible for a visit in 6 week(s).   Specialty:  Obstetrics and Gynecology Contact information: [address] [place] [id number] [phone]           Newborn Data: Live born female  Birth Weight: 7 lb 12.9 oz (3541 g) APGAR: 9, 9  Home with mother.  Marlinda MikeBAILEY, Zephyr Ridley 11/28/2015, 10:48 AM

## 2016-01-18 DIAGNOSIS — Z30430 Encounter for insertion of intrauterine contraceptive device: Secondary | ICD-10-CM | POA: Diagnosis not present

## 2016-02-14 DIAGNOSIS — Z30431 Encounter for routine checking of intrauterine contraceptive device: Secondary | ICD-10-CM | POA: Diagnosis not present

## 2017-04-14 DIAGNOSIS — N6310 Unspecified lump in the right breast, unspecified quadrant: Secondary | ICD-10-CM | POA: Diagnosis not present

## 2017-04-15 DIAGNOSIS — H52223 Regular astigmatism, bilateral: Secondary | ICD-10-CM | POA: Diagnosis not present

## 2017-04-15 DIAGNOSIS — H5203 Hypermetropia, bilateral: Secondary | ICD-10-CM | POA: Diagnosis not present

## 2017-04-16 DIAGNOSIS — N6314 Unspecified lump in the right breast, lower inner quadrant: Secondary | ICD-10-CM | POA: Diagnosis not present

## 2017-04-20 DIAGNOSIS — N6314 Unspecified lump in the right breast, lower inner quadrant: Secondary | ICD-10-CM | POA: Diagnosis not present

## 2017-04-20 DIAGNOSIS — N6011 Diffuse cystic mastopathy of right breast: Secondary | ICD-10-CM | POA: Diagnosis not present

## 2017-04-20 DIAGNOSIS — R921 Mammographic calcification found on diagnostic imaging of breast: Secondary | ICD-10-CM | POA: Diagnosis not present

## 2017-10-20 DIAGNOSIS — R922 Inconclusive mammogram: Secondary | ICD-10-CM | POA: Diagnosis not present

## 2018-03-04 DIAGNOSIS — Z01419 Encounter for gynecological examination (general) (routine) without abnormal findings: Secondary | ICD-10-CM | POA: Diagnosis not present

## 2018-03-04 DIAGNOSIS — Z6825 Body mass index (BMI) 25.0-25.9, adult: Secondary | ICD-10-CM | POA: Diagnosis not present

## 2018-10-18 DIAGNOSIS — Z0000 Encounter for general adult medical examination without abnormal findings: Secondary | ICD-10-CM | POA: Diagnosis not present

## 2018-10-18 DIAGNOSIS — Z1331 Encounter for screening for depression: Secondary | ICD-10-CM | POA: Diagnosis not present

## 2018-12-14 DIAGNOSIS — R5383 Other fatigue: Secondary | ICD-10-CM | POA: Diagnosis not present

## 2018-12-14 DIAGNOSIS — R102 Pelvic and perineal pain: Secondary | ICD-10-CM | POA: Diagnosis not present

## 2018-12-14 DIAGNOSIS — Z3200 Encounter for pregnancy test, result unknown: Secondary | ICD-10-CM | POA: Diagnosis not present

## 2018-12-14 DIAGNOSIS — R110 Nausea: Secondary | ICD-10-CM | POA: Diagnosis not present

## 2018-12-15 DIAGNOSIS — N83201 Unspecified ovarian cyst, right side: Secondary | ICD-10-CM | POA: Diagnosis not present

## 2019-02-01 DIAGNOSIS — H5713 Ocular pain, bilateral: Secondary | ICD-10-CM | POA: Diagnosis not present

## 2019-03-18 DIAGNOSIS — Z6824 Body mass index (BMI) 24.0-24.9, adult: Secondary | ICD-10-CM | POA: Diagnosis not present

## 2019-03-18 DIAGNOSIS — Z01419 Encounter for gynecological examination (general) (routine) without abnormal findings: Secondary | ICD-10-CM | POA: Diagnosis not present

## 2019-04-28 DIAGNOSIS — Z23 Encounter for immunization: Secondary | ICD-10-CM | POA: Diagnosis not present

## 2019-05-04 DIAGNOSIS — Z8639 Personal history of other endocrine, nutritional and metabolic disease: Secondary | ICD-10-CM | POA: Diagnosis not present

## 2019-05-04 DIAGNOSIS — R7989 Other specified abnormal findings of blood chemistry: Secondary | ICD-10-CM | POA: Diagnosis not present

## 2019-05-04 DIAGNOSIS — Z0000 Encounter for general adult medical examination without abnormal findings: Secondary | ICD-10-CM | POA: Diagnosis not present

## 2019-05-10 DIAGNOSIS — Z1331 Encounter for screening for depression: Secondary | ICD-10-CM | POA: Diagnosis not present

## 2019-05-10 DIAGNOSIS — Z0000 Encounter for general adult medical examination without abnormal findings: Secondary | ICD-10-CM | POA: Diagnosis not present

## 2020-02-14 DIAGNOSIS — H5713 Ocular pain, bilateral: Secondary | ICD-10-CM | POA: Diagnosis not present

## 2020-04-16 DIAGNOSIS — D2262 Melanocytic nevi of left upper limb, including shoulder: Secondary | ICD-10-CM | POA: Diagnosis not present

## 2020-04-16 DIAGNOSIS — D2239 Melanocytic nevi of other parts of face: Secondary | ICD-10-CM | POA: Diagnosis not present

## 2020-04-16 DIAGNOSIS — D2261 Melanocytic nevi of right upper limb, including shoulder: Secondary | ICD-10-CM | POA: Diagnosis not present

## 2020-04-16 DIAGNOSIS — D225 Melanocytic nevi of trunk: Secondary | ICD-10-CM | POA: Diagnosis not present

## 2020-04-23 DIAGNOSIS — Z124 Encounter for screening for malignant neoplasm of cervix: Secondary | ICD-10-CM | POA: Diagnosis not present

## 2020-04-23 DIAGNOSIS — N943 Premenstrual tension syndrome: Secondary | ICD-10-CM | POA: Diagnosis not present

## 2020-04-23 DIAGNOSIS — Z6824 Body mass index (BMI) 24.0-24.9, adult: Secondary | ICD-10-CM | POA: Diagnosis not present

## 2020-04-23 DIAGNOSIS — Z01419 Encounter for gynecological examination (general) (routine) without abnormal findings: Secondary | ICD-10-CM | POA: Diagnosis not present

## 2020-04-23 DIAGNOSIS — Z01411 Encounter for gynecological examination (general) (routine) with abnormal findings: Secondary | ICD-10-CM | POA: Diagnosis not present

## 2020-04-23 DIAGNOSIS — Z113 Encounter for screening for infections with a predominantly sexual mode of transmission: Secondary | ICD-10-CM | POA: Diagnosis not present

## 2020-05-17 DIAGNOSIS — R1031 Right lower quadrant pain: Secondary | ICD-10-CM | POA: Diagnosis not present

## 2021-01-30 DIAGNOSIS — R011 Cardiac murmur, unspecified: Secondary | ICD-10-CM | POA: Diagnosis not present

## 2021-02-06 DIAGNOSIS — Z1331 Encounter for screening for depression: Secondary | ICD-10-CM | POA: Diagnosis not present

## 2021-02-06 DIAGNOSIS — Z0000 Encounter for general adult medical examination without abnormal findings: Secondary | ICD-10-CM | POA: Diagnosis not present

## 2021-02-06 DIAGNOSIS — Z1339 Encounter for screening examination for other mental health and behavioral disorders: Secondary | ICD-10-CM | POA: Diagnosis not present

## 2021-06-14 DIAGNOSIS — G44209 Tension-type headache, unspecified, not intractable: Secondary | ICD-10-CM | POA: Diagnosis not present

## 2021-06-19 DIAGNOSIS — Z01419 Encounter for gynecological examination (general) (routine) without abnormal findings: Secondary | ICD-10-CM | POA: Diagnosis not present

## 2021-06-19 DIAGNOSIS — Z6825 Body mass index (BMI) 25.0-25.9, adult: Secondary | ICD-10-CM | POA: Diagnosis not present

## 2021-06-19 DIAGNOSIS — Z30432 Encounter for removal of intrauterine contraceptive device: Secondary | ICD-10-CM | POA: Diagnosis not present

## 2021-11-04 DIAGNOSIS — R1032 Left lower quadrant pain: Secondary | ICD-10-CM | POA: Diagnosis not present

## 2021-11-04 DIAGNOSIS — R350 Frequency of micturition: Secondary | ICD-10-CM | POA: Diagnosis not present

## 2021-11-06 DIAGNOSIS — R1032 Left lower quadrant pain: Secondary | ICD-10-CM | POA: Diagnosis not present
